# Patient Record
Sex: Female | Born: 1975 | Race: White | Hispanic: No | Smoking: Current every day smoker
Health system: Southern US, Community
[De-identification: ages and names within clinical notes are randomized; demographics above are authoritative.]

---

## 2008-12-22 DIAGNOSIS — O24419 Gestational diabetes mellitus in pregnancy, unspecified control: Secondary | ICD-10-CM | POA: Insufficient documentation

## 2008-12-22 DIAGNOSIS — I10 Essential (primary) hypertension: Secondary | ICD-10-CM | POA: Insufficient documentation

## 2010-09-20 DIAGNOSIS — R0902 Hypoxemia: Secondary | ICD-10-CM | POA: Insufficient documentation

## 2010-09-20 DIAGNOSIS — J45909 Unspecified asthma, uncomplicated: Secondary | ICD-10-CM | POA: Insufficient documentation

## 2011-12-02 ENCOUNTER — Ambulatory Visit: Payer: Self-pay | Admitting: Medical

## 2012-11-23 ENCOUNTER — Emergency Department: Payer: Self-pay | Admitting: Emergency Medicine

## 2012-11-23 LAB — URINALYSIS, COMPLETE
Bacteria: NONE SEEN
Bilirubin,UR: NEGATIVE
Ketone: NEGATIVE
Leukocyte Esterase: NEGATIVE
Ph: 6 (ref 4.5–8.0)
Specific Gravity: 1.004 (ref 1.003–1.030)
WBC UR: 1 /HPF (ref 0–5)

## 2012-11-27 ENCOUNTER — Emergency Department: Payer: Self-pay | Admitting: Internal Medicine

## 2012-11-27 LAB — URINALYSIS, COMPLETE
Bilirubin,UR: NEGATIVE
Nitrite: NEGATIVE
RBC,UR: 130 /HPF (ref 0–5)
Specific Gravity: 1.024 (ref 1.003–1.030)
Squamous Epithelial: 2
WBC UR: 13 /HPF (ref 0–5)

## 2014-12-05 ENCOUNTER — Emergency Department
Admission: EM | Admit: 2014-12-05 | Discharge: 2014-12-05 | Disposition: A | Discharge status: Home / Self Care | Payer: Medicaid Other | Attending: Emergency Medicine | Admitting: Emergency Medicine

## 2014-12-05 ENCOUNTER — Emergency Department: Payer: Medicaid Other

## 2014-12-05 ENCOUNTER — Encounter: Payer: Self-pay | Admitting: *Deleted

## 2014-12-05 DIAGNOSIS — Z720 Tobacco use: Secondary | ICD-10-CM | POA: Insufficient documentation

## 2014-12-05 DIAGNOSIS — I10 Essential (primary) hypertension: Secondary | ICD-10-CM | POA: Diagnosis not present

## 2014-12-05 DIAGNOSIS — R109 Unspecified abdominal pain: Secondary | ICD-10-CM | POA: Diagnosis not present

## 2014-12-05 DIAGNOSIS — R197 Diarrhea, unspecified: Secondary | ICD-10-CM | POA: Diagnosis not present

## 2014-12-05 DIAGNOSIS — R112 Nausea with vomiting, unspecified: Secondary | ICD-10-CM | POA: Diagnosis not present

## 2014-12-05 DIAGNOSIS — J069 Acute upper respiratory infection, unspecified: Secondary | ICD-10-CM | POA: Insufficient documentation

## 2014-12-05 LAB — URINALYSIS COMPLETE WITH MICROSCOPIC (ARMC ONLY)
Bilirubin Urine: NEGATIVE
Glucose, UA: NEGATIVE mg/dL
HGB URINE DIPSTICK: NEGATIVE
Ketones, ur: NEGATIVE mg/dL
Nitrite: NEGATIVE
PH: 6 (ref 5.0–8.0)
Protein, ur: 30 mg/dL — AB
Specific Gravity, Urine: 1.018 (ref 1.005–1.030)

## 2014-12-05 LAB — COMPREHENSIVE METABOLIC PANEL
ALK PHOS: 61 U/L (ref 38–126)
ALT: 15 U/L (ref 14–54)
AST: 14 U/L — ABNORMAL LOW (ref 15–41)
Albumin: 4.1 g/dL (ref 3.5–5.0)
Anion gap: 12 (ref 5–15)
BUN: 14 mg/dL (ref 6–20)
CALCIUM: 8.9 mg/dL (ref 8.9–10.3)
CHLORIDE: 103 mmol/L (ref 101–111)
CO2: 23 mmol/L (ref 22–32)
Creatinine, Ser: 0.46 mg/dL (ref 0.44–1.00)
GFR calc non Af Amer: >60 mL/min (ref >60)
Glucose, Bld: 115 mg/dL — ABNORMAL HIGH (ref 65–99)
Potassium: 2.9 mmol/L — CL (ref 3.5–5.1)
SODIUM: 138 mmol/L (ref 135–145)
Total Bilirubin: 0.7 mg/dL (ref 0.3–1.2)
Total Protein: 7.3 g/dL (ref 6.5–8.1)

## 2014-12-05 LAB — CBC
HEMATOCRIT: 41.7 % (ref 35.0–47.0)
Hemoglobin: 13.7 g/dL (ref 12.0–16.0)
MCH: 24.3 pg — AB (ref 26.0–34.0)
MCHC: 32.8 g/dL (ref 32.0–36.0)
MCV: 74.1 fL — AB (ref 80.0–100.0)
Platelets: 229 10*3/uL (ref 150–440)
RBC: 5.63 MIL/uL — AB (ref 3.80–5.20)
RDW: 16.5 % — AB (ref 11.5–14.5)
WBC: 12.7 10*3/uL — AB (ref 3.6–11.0)

## 2014-12-05 LAB — LIPASE, BLOOD: LIPASE: 28 U/L (ref 22–51)

## 2014-12-05 LAB — TROPONIN I

## 2014-12-05 MED ORDER — SODIUM CHLORIDE 0.9 % IV BOLUS (SEPSIS)
1000.0000 mL | Freq: Once | INTRAVENOUS | Status: AC
  Administered 2014-12-05: 1000 mL via INTRAVENOUS

## 2014-12-05 MED ORDER — POTASSIUM CHLORIDE CRYS ER 20 MEQ PO TBCR: 40.0000 meq | EXTENDED_RELEASE_TABLET | Freq: Once | ORAL | Status: DC

## 2014-12-05 MED ORDER — HYDRALAZINE HCL 20 MG/ML IJ SOLN
5.0000 mg | Freq: Once | INTRAMUSCULAR | Status: AC
  Administered 2014-12-05: 5 mg via INTRAVENOUS
  Filled 2014-12-05: qty 1

## 2014-12-05 MED ORDER — AZITHROMYCIN 250 MG PO TABS: ORAL_TABLET | ORAL | Status: AC

## 2014-12-05 MED ORDER — POTASSIUM CHLORIDE CRYS ER 20 MEQ PO TBCR
40.0000 meq | EXTENDED_RELEASE_TABLET | Freq: Once | ORAL | Status: AC
  Administered 2014-12-05: 40 meq via ORAL

## 2014-12-05 MED ORDER — POTASSIUM CHLORIDE CRYS ER 20 MEQ PO TBCR
EXTENDED_RELEASE_TABLET | ORAL | Status: AC
  Administered 2014-12-05: 40 meq via ORAL
  Filled 2014-12-05: qty 2

## 2014-12-05 MED ORDER — PREDNISONE 20 MG PO TABS: 60.0000 mg | ORAL_TABLET | Freq: Every day | ORAL | Status: AC

## 2014-12-05 NOTE — Discharge Instructions (Signed)
Please make an appointment with the primary care physician or with the [facility] clinic if you do not have a primary care physician. Please have them reevaluate you for your cough and cold symptoms, and also recheck your blood pressure because it was high in the emergency department today.  Please return to the emergency department you develop severe shortness of breath, chest pain, fainting, or any other symptoms concerning to you.

## 2014-12-05 NOTE — ED Provider Notes (Signed)
[facility] Emergency Department Provider Note  ____________________________________________  Time seen: Approximately 3:25 PM  I have reviewed the triage vital signs and the nursing notes.   HISTORY  Chief Complaint Nausea; Emesis; Headache; and Chest Pain    HPI Tami Wallace is a 39 y.o. female , otherwise healthy, presenting with general malaise, cough and cold symptoms, nausea vomiting and diarrhea. Patient reports that 5 days ago she had 24 hours of nonbloody vomiting. Then she developed 24 hours of loose stool that was also nonbloody. Over the past 3 days she has had decreased by mouth intake due to decreased appetite, and has developed congestion and a nonproductive cough. She notes that she has occasional chest tightness with exertion that resolves with rest. She also has mild shortness of breath with exertion. She also reports generalized malaise and myalgias. She is an ongoing tobacco user.No known sick contacts or recent travel outside the [place].   History reviewed. No pertinent past medical history.  No chronic medical illnesses  There are no active problems to display for this patient.   History reviewed. No pertinent past surgical history.  Current Outpatient Rx  Name  Route  Sig  Dispense  Refill  . azithromycin (ZITHROMAX Z-PAK) 250 MG tablet      Take 2 tablets (500 mg) on  Day 1,  followed by 1 tablet (250 mg) once daily on Days 2 through 5.   6 each   0   . predniSONE (DELTASONE) 20 MG tablet   Oral   Take 3 tablets (60 mg total) by mouth daily.   15 tablet   0     Allergies Review of patient's allergies indicates no known allergies.  History reviewed. No pertinent family history.  Social History Social History  Substance Use Topics  . Smoking status: Current Every Day Smoker  . Smokeless tobacco: None  . Alcohol Use: None    Review of Systems Constitutional: No fever/chills.  No syncope Eyes: No visual  changes. ENT: No sore throat. Positive for congestion. Cardiovascular: Positive for chest tightness with exertion. No palpitations.  Respiratory: Mild shortness of breath with exertion only.  Nonproductive cough. Gastrointestinal: No abdominal pain.  Nausea, vomiting, diarrhea which have all resolved.  No constipation. Genitourinary: Negative for dysuria. Musculoskeletal: Negative for back pain. Skin: Negative for rash. Neurological: Negative for headaches, focal weakness or numbness.  10-point ROS otherwise negative.  ____________________________________________   PHYSICAL EXAM:  VITAL SIGNS: ED Triage Vitals  Enc Vitals Group     BP 12/05/14 1159 173/79 mmHg     Pulse Rate 12/05/14 1159 66     Resp 12/05/14 1159 20     Temp 12/05/14 1159 98.7 F (37.1 C)     Temp Source 12/05/14 1159 Oral     SpO2 12/05/14 1159 99 %     Weight 12/05/14 1159 200 lb (90.719 kg)     Height 12/05/14 1159  (1.6 m)     Head Cir --      Peak Flow --      Pain Score 12/05/14 1200 7     Pain Loc --      Pain Edu? --      Excl. in GC? --     Constitutional: Patient is alert and oriented and resting in the stretcher comfortably. She is speaking in full sentences and answering questions properly. Eyes: Conjunctivae are normal.  EOMI. no scleral icterus. Head: Atraumatic. Notable for significant facial hair. Nose:  Positive for congestion without rhinorrhea. Mouth/Throat: Mucous membranes are dry. Neck: No stridor.  Supple. JVD. Cardiovascular: Normal rate, regular rhythm. No murmurs, rubs or gallops.  Respiratory: Normal respiratory effort.  No retractions or accessory muscle use.. Lungs CTAB.  Mild rales in the right lung base, minimal expiratory wheezing.. Gastrointestinal: Obese. Soft and nondistended. Mild tenderness to palpation diffusely in the right side without a Murphy's sign.. No peritoneal signs. Musculoskeletal: No LE edema.  Neurologic:  Normal speech and language. No gross focal  neurologic deficits are appreciated.  Skin:  Skin is warm, dry and intact. No rash noted. Psychiatric: Mood and affect are normal. Speech and behavior are normal.  Normal judgement.  ____________________________________________   LABS (all labs ordered are listed, but only abnormal results are displayed)  Labs Reviewed  COMPREHENSIVE METABOLIC PANEL - Abnormal; Notable for the following:    Potassium 2.9 (*)    Glucose, Bld 115 (*)    AST 14 (*)    All other components within normal limits  CBC - Abnormal; Notable for the following:    WBC 12.7 (*)    RBC 5.63 (*)    MCV 74.1 (*)    MCH 24.3 (*)    RDW 16.5 (*)    All other components within normal limits  URINALYSIS COMPLETEWITH MICROSCOPIC ([facility] ONLY) - Abnormal; Notable for the following:    Color, Urine YELLOW (*)    APPearance HAZY (*)    Protein, ur 30 (*)    Leukocytes, UA TRACE (*)    Bacteria, UA RARE (*)    Squamous Epithelial / LPF 0-5 (*)    All other components within normal limits  LIPASE, BLOOD  TROPONIN I  POC URINE PREG, ED   ____________________________________________  EKG  ED ECG REPORT I, Rockne Menghini, the attending physician, personally viewed and interpreted this ECG.   Date: 12/05/2014  EKG Time: 1203  Rate: 65  Rhythm: normal sinus rhythm  Axis: Leftward  Intervals:none  ST&T Change: No ST elevation. No STEMI. No STEMI changes.  ____________________________________________  RADIOLOGY  Dg Chest 2 View  12/05/2014   CLINICAL DATA:  Nonproductive cough.  Fever.  EXAM: CHEST  2 VIEW  COMPARISON:  Chest radiograph 12/02/2011  FINDINGS: Stable cardiac and mediastinal contours. No consolidative pulmonary opacities. No pleural effusion or pneumothorax. Regional skeleton is unremarkable  IMPRESSION: No acute cardiopulmonary process.   Electronically Signed   By: Annia Belt M.D.   On: 12/05/2014 15:58    ____________________________________________   PROCEDURES  Procedure(s)  performed: None  Critical Care performed: No ____________________________________________   INITIAL IMPRESSION / ASSESSMENT AND PLAN / ED COURSE  Pertinent labs & imaging results that were available during my care of the patient were reviewed by me and considered in my medical decision making (see chart for details).  39 y.o. female, tobacco user but otherwise healthy, presenting with resolved nausea vomiting and diarrhea, now with cough and cold symptoms. Her lung exam does demonstrate some rales in the right lung base. Consider flulike illness, pneumonia area did her abdomen is soft with nonfocal findings. Acute intra-abdominal pathology is much less likely including appendicitis, obstruction, or colitis. Plan to get a chest x-ray, treat her hypokalemia, rehydrate her with IV fluids, and reassess her for final disposition.  ----------------------------------------- 4:35 PM on 12/05/2014 -----------------------------------------  At this time, the patient continues to be stable with the exception of an elevated blood pressure. She has never been diagnosed with hypertension in the past, but this does run  in her family. I have encouraged her to follow up with her primary care physician for this. She has received IV fluids and is tolerating by mouth. It is unlikely that she has an acute intra-abdominal pathology. Her chest x-ray does not show a pneumonia, so it is likely that she has an upper respiratory illness that is exacerbated by her smoking. I will discharge her with steroids given her long tobacco history, and give her prescription for Z-Pak which she can initiate if she develops fever, worsening shortness of breath, or her symptoms are not resolving. She will follow up with her PMD. She understands return precautions and follow-up instructions.  ____________________________________________  FINAL CLINICAL IMPRESSION(S) / ED DIAGNOSES  Final diagnoses:  Nausea vomiting and diarrhea  URI  (upper respiratory infection)  Essential hypertension      NEW MEDICATIONS STARTED DURING THIS VISIT:  New Prescriptions   AZITHROMYCIN (ZITHROMAX Z-PAK) 250 MG TABLET    Take 2 tablets (500 mg) on  Day 1,  followed by 1 tablet (250 mg) once daily on Days 2 through 5.   PREDNISONE (DELTASONE) 20 MG TABLET    Take 3 tablets (60 mg total) by mouth daily.     Rockne Menghini, MD 12/05/14 [phone]

## 2014-12-05 NOTE — ED Notes (Signed)
Pt states vomiting on Monday and since then chest and head tightness with sharp pain, states she has been able to eat and drink

## 2014-12-05 NOTE — ED Notes (Signed)
MD made aware of patients current BP, per MD, OK to discharge patient.

## 2019-04-15 ENCOUNTER — Other Ambulatory Visit: Payer: Self-pay

## 2019-04-15 ENCOUNTER — Emergency Department
Admission: EM | Admit: 2019-04-15 | Discharge: 2019-04-15 | Disposition: A | Discharge status: Home / Self Care | Payer: Self-pay | Attending: Emergency Medicine | Admitting: Emergency Medicine

## 2019-04-15 ENCOUNTER — Emergency Department: Payer: Self-pay

## 2019-04-15 DIAGNOSIS — R030 Elevated blood-pressure reading, without diagnosis of hypertension: Secondary | ICD-10-CM | POA: Insufficient documentation

## 2019-04-15 DIAGNOSIS — M25461 Effusion, right knee: Secondary | ICD-10-CM | POA: Insufficient documentation

## 2019-04-15 DIAGNOSIS — F17210 Nicotine dependence, cigarettes, uncomplicated: Secondary | ICD-10-CM | POA: Insufficient documentation

## 2019-04-15 MED ORDER — NAPROXEN 500 MG PO TABS: 500.0000 mg | ORAL_TABLET | Freq: Two times a day (BID) | ORAL | Status: AC

## 2019-04-15 MED ORDER — TRAMADOL HCL 50 MG PO TABS: 50.0000 mg | ORAL_TABLET | Freq: Four times a day (QID) | ORAL | 0 refills | Status: AC | PRN

## 2019-04-15 NOTE — ED Triage Notes (Signed)
Outer right knee pain and swelling with no injury X 2 weeks.

## 2019-04-15 NOTE — ED Notes (Signed)
See triage note  Presents with right knee pain   States pain started about 2 weeks ago  No injury  Ambulates with slight limp

## 2019-04-15 NOTE — ED Provider Notes (Signed)
[facility] Emergency Department Provider Note   ____________________________________________   First MD Initiated Contact with Patient 04/15/19 1250     (approximate)  I have reviewed the triage vital signs and the nursing notes.   HISTORY  Chief Complaint Knee Pain    HPI Tami Wallace is a 44 y.o. female patient complains of 2 weeks of increasing right knee pain.  Patient denies provocative incident for complaint.  Patient rates the pain as a 6/10.  Patient described pain as "achy".  No palliative measures for complaint it was noted patient blood pressure elevated 215/94.  Patient states she surprised that she has no history of elevated blood pressures.  Patient has headache, vision disturbance, vertigo.  We will retake blood pressure.      History reviewed. No pertinent past medical history.  There are no problems to display for this patient.   History reviewed. No pertinent surgical history.  Prior to Admission medications   Medication Sig Start Date End Date Taking? Authorizing Provider  naproxen (NAPROSYN) 500 MG tablet Take 1 tablet (500 mg total) by mouth 2 (two) times daily with a meal. 04/15/19   Joni Reining, PA-C  traMADol (ULTRAM) 50 MG tablet Take 1 tablet (50 mg total) by mouth every 6 (six) hours as needed for up to 3 days. 04/15/19 04/18/19  Joni Reining, PA-C    Allergies Patient has no known allergies.  No family history on file.  Social History Social History   Tobacco Use  . Smoking status: Current Every Day Smoker  Substance Use Topics  . Alcohol use: Not on file  . Drug use: Not on file    Review of Systems Constitutional: No fever/chills Eyes: No visual changes. ENT: No sore throat. Cardiovascular: Denies chest pain. Respiratory: Denies shortness of breath. Gastrointestinal: No abdominal pain.  No nausea, no vomiting.  No diarrhea.  No constipation. Genitourinary: Negative for  dysuria. Musculoskeletal: Right knee pain. Skin: Negative for rash. Neurological: Negative for headaches, focal weakness or numbness.   ____________________________________________   PHYSICAL EXAM:  VITAL SIGNS: ED Triage Vitals [04/15/19 1222]  Enc Vitals Group     BP (!) 215/94     Pulse Rate (!) 102     Resp 18     Temp 98.7 F (37.1 C)     Temp Source Oral     SpO2 97 %     Weight 195 lb (88.5 kg)     Height 5\' 2"  (1.575 m)     Head Circumference      Peak Flow      Pain Score 6     Pain Loc      Pain Edu?      Excl. in GC?    Constitutional: Alert and oriented. Well appearing and in no acute distress. Cardiovascular: Normal rate, regular rhythm. Grossly normal heart sounds.  Good peripheral circulation. Respiratory: Normal respiratory effort.  No retractions. Lungs CTAB. Musculoskeletal: No obvious deformity to the right knee.  Moderate guarding palpation at insertion point of the MCL.   Neurologic:  Normal speech and language. No gross focal neurologic deficits are appreciated. No gait instability. Skin:  Skin is warm, dry and intact. No rash noted. Psychiatric: Mood and affect are normal. Speech and behavior are normal.  ____________________________________________   LABS (all labs ordered are listed, but only abnormal results are displayed)  Labs Reviewed - No data to display ____________________________________________  EKG   ____________________________________________  RADIOLOGY  ED  MD interpretation:    Official radiology report(s): DG Knee Complete 4 Views Right  Result Date: 04/15/2019 CLINICAL DATA:  Knee pain EXAM: RIGHT KNEE - COMPLETE 4+ VIEW COMPARISON:  None. FINDINGS: There is no acute fracture or dislocation. Minimal marginal spurring. There is no joint space narrowing. Possible joint effusion on suboptimal lateral view. IMPRESSION: No significant osseous abnormality.  Possible joint effusion. Electronically Signed   By: Macy Mis  M.D.   On: 04/15/2019 13:26    ____________________________________________   PROCEDURES  Procedure(s) performed (including Critical Care):  Procedures   ____________________________________________   INITIAL IMPRESSION / ASSESSMENT AND PLAN / ED COURSE  As part of my medical decision making, I reviewed the following data within the [place]     Patient presents with 2 weeks of nontraumatic knee pain and edema.  Discussed x-ray findings with patient.  Physical exam consistent with knee effusion.  Patient knee was Ace wrapped and she is given discharge care instruction.  Advised take medication as directed.  Advised follow orthopedic if no improvement in 7 to 10 days.   Tami Wallace was evaluated in Emergency Department on 04/15/2019 for the symptoms described in the history of present illness. She was evaluated in the context of the global COVID-19 pandemic, which necessitated consideration that the patient might be at risk for infection with the SARS-CoV-2 virus that causes COVID-19. Institutional protocols and algorithms that pertain to the evaluation of patients at risk for COVID-19 are in a state of rapid change based on information released by regulatory bodies including the CDC and federal and state organizations. These policies and algorithms were followed during the patient's care in the ED.       ____________________________________________   FINAL CLINICAL IMPRESSION(S) / ED DIAGNOSES  Final diagnoses:  Effusion of right knee  Elevated blood-pressure reading without diagnosis of hypertension     ED Discharge Orders         Ordered    naproxen (NAPROSYN) 500 MG tablet  2 times daily with meals     04/15/19 1341    traMADol (ULTRAM) 50 MG tablet  Every 6 hours PRN     04/15/19 1341           Note:  This document was prepared using Dragon voice recognition software and may include unintentional dictation errors.    Sable Feil,  PA-C 04/15/19 1406    Carrie Mew, MD 04/15/19 [phone]

## 2019-04-15 NOTE — Discharge Instructions (Addendum)
Follow discharge care instruction take medication as directed.  If no improvement in 1 week call orthopedic doctor listed on your discharge care instructions.  Advised 3-day blood pressure check [facility].

## 2019-08-25 ENCOUNTER — Emergency Department
Admission: EM | Admit: 2019-08-25 | Discharge: 2019-08-25 | Disposition: A | Discharge status: Home / Self Care | Payer: Medicaid Other | Attending: Emergency Medicine | Admitting: Emergency Medicine

## 2019-08-25 ENCOUNTER — Encounter: Payer: Self-pay | Admitting: Emergency Medicine

## 2019-08-25 ENCOUNTER — Other Ambulatory Visit: Payer: Self-pay

## 2019-08-25 ENCOUNTER — Emergency Department: Payer: Medicaid Other

## 2019-08-25 DIAGNOSIS — M25561 Pain in right knee: Secondary | ICD-10-CM | POA: Insufficient documentation

## 2019-08-25 DIAGNOSIS — I10 Essential (primary) hypertension: Secondary | ICD-10-CM | POA: Insufficient documentation

## 2019-08-25 DIAGNOSIS — Z79899 Other long term (current) drug therapy: Secondary | ICD-10-CM | POA: Insufficient documentation

## 2019-08-25 DIAGNOSIS — F17200 Nicotine dependence, unspecified, uncomplicated: Secondary | ICD-10-CM | POA: Insufficient documentation

## 2019-08-25 LAB — BASIC METABOLIC PANEL
Anion gap: 12 (ref 5–15)
BUN: 13 mg/dL (ref 6–20)
CO2: 22 mmol/L (ref 22–32)
Calcium: 8.9 mg/dL (ref 8.9–10.3)
Chloride: 103 mmol/L (ref 98–111)
Creatinine, Ser: 0.59 mg/dL (ref 0.44–1.00)
GFR calc Af Amer: >60 mL/min (ref >60)
GFR calc non Af Amer: >60 mL/min (ref >60)
Glucose, Bld: 114 mg/dL — ABNORMAL HIGH (ref 70–99)
Potassium: 3.3 mmol/L — ABNORMAL LOW (ref 3.5–5.1)
Sodium: 137 mmol/L (ref 135–145)

## 2019-08-25 LAB — CBC
HCT: 50.1 % — ABNORMAL HIGH (ref 36.0–46.0)
Hemoglobin: 17.1 g/dL — ABNORMAL HIGH (ref 12.0–15.0)
MCH: 29 pg (ref 26.0–34.0)
MCHC: 34.1 g/dL (ref 30.0–36.0)
MCV: 85.1 fL (ref 80.0–100.0)
Platelets: 221 10*3/uL (ref 150–400)
RBC: 5.89 MIL/uL — ABNORMAL HIGH (ref 3.87–5.11)
RDW: 13.9 % (ref 11.5–15.5)
WBC: 13.1 10*3/uL — ABNORMAL HIGH (ref 4.0–10.5)
nRBC: 0 % (ref 0.0–0.2)

## 2019-08-25 MED ORDER — IBUPROFEN 400 MG PO TABS
600.0000 mg | ORAL_TABLET | Freq: Once | ORAL | Status: AC
  Administered 2019-08-25: 600 mg via ORAL
  Filled 2019-08-25: qty 2

## 2019-08-25 MED ORDER — LISINOPRIL 10 MG PO TABS
10.0000 mg | ORAL_TABLET | Freq: Once | ORAL | Status: AC
  Administered 2019-08-25: 10 mg via ORAL
  Filled 2019-08-25: qty 1

## 2019-08-25 MED ORDER — CLONIDINE HCL 0.1 MG PO TABS
0.1000 mg | ORAL_TABLET | Freq: Once | ORAL | Status: AC
  Administered 2019-08-25: 0.1 mg via ORAL
  Filled 2019-08-25: qty 1

## 2019-08-25 MED ORDER — BLOOD PRESSURE KIT: 1.0000 | PACK | Freq: Once | 0 refills | Status: AC

## 2019-08-25 MED ORDER — LISINOPRIL 10 MG PO TABS
10.0000 mg | ORAL_TABLET | Freq: Every day | ORAL | 11 refills | Status: AC
Start: 2019-08-25 — End: 2020-08-24

## 2019-08-25 NOTE — ED Triage Notes (Signed)
Patient presents to the ED with pain and swelling to right knee for several months and headache x 2 days intermittently.  Patient denies dizziness and blurry vision.  Patient denies history of hypertension but reports family history of hypertension.

## 2019-08-25 NOTE — ED Provider Notes (Signed)
[facility] Emergency Department Provider Note  ____________________________________________   First MD Initiated Contact with Patient 08/25/19 1200     (approximate)  I have reviewed the triage vital signs and the nursing notes.   HISTORY  Chief Complaint Headache and Leg Swelling    HPI Tami Wallace is a 44 y.o. female  With no significant PMHx here with headache. Pt reports 3-4 days of gradual onset, aching, generalized headache. HA began gradually and ahs been off and on. It improves with tylenol then returns. She denies any associated vision changes, numbness, weakness, or other symptoms. No nausea, vomiting. No neck pain, neck stiffness, or fever. She has a strong family h/o HTN and admits that she has not been seen recently by an MD. No recent stressors. She does also c/o R knee pain but this has been a chronic, recurrent issue for which she was just seen on 6/19. No new redness, welling, warmth, or injury.       History reviewed. No pertinent past medical history.  There are no problems to display for this patient.   History reviewed. No pertinent surgical history.  Prior to Admission medications   Medication Sig Start Date End Date Taking? Authorizing Provider  Blood Pressure KIT 1 kit by Does not apply route once for 1 dose. 08/25/19 08/25/19  Duffy Bruce, MD  lisinopril (ZESTRIL) 10 MG tablet Take 1 tablet (10 mg total) by mouth daily. 08/25/19 08/24/20  Duffy Bruce, MD  naproxen (NAPROSYN) 500 MG tablet Take 1 tablet (500 mg total) by mouth 2 (two) times daily with a meal. 04/15/19   Sable Feil, PA-C    Allergies Patient has no known allergies.  No family history on file.  Social History Social History   Tobacco Use  . Smoking status: Current Every Day Smoker  . Smokeless tobacco: Never Used  Substance Use Topics  . Alcohol use: Not on file  . Drug use: Not on file    Review of Systems  Review of Systems    Constitutional: Positive for fatigue. Negative for fever.  HENT: Negative for congestion and sore throat.   Eyes: Negative for visual disturbance.  Respiratory: Negative for cough and shortness of breath.   Cardiovascular: Negative for chest pain.  Gastrointestinal: Negative for abdominal pain, diarrhea, nausea and vomiting.  Genitourinary: Negative for flank pain.  Musculoskeletal: Positive for arthralgias. Negative for back pain and neck pain.  Skin: Negative for rash and wound.  Neurological: Positive for headaches. Negative for weakness.  All other systems reviewed and are negative.    ____________________________________________  PHYSICAL EXAM:      VITAL SIGNS: ED Triage Vitals  Enc Vitals Group     BP 08/25/19 0905 (!) 209/100     Pulse Rate 08/25/19 0905 85     Resp 08/25/19 0905 18     Temp 08/25/19 0905 98.2 F (36.8 C)     Temp Source 08/25/19 0905 Oral     SpO2 08/25/19 0905 97 %     Weight 08/25/19 0905 200 lb (90.7 kg)     Height 08/25/19 0905 5' 3"  (1.6 m)     Head Circumference --      Peak Flow --      Pain Score 08/25/19 0910 2     Pain Loc --      Pain Edu? --      Excl. in [place]? --      Physical Exam Vitals and nursing note reviewed.  Constitutional:      General: She is not in acute distress.    Appearance: She is well-developed.  HENT:     Head: Normocephalic and atraumatic.  Eyes:     Conjunctiva/sclera: Conjunctivae normal.  Cardiovascular:     Rate and Rhythm: Normal rate and regular rhythm.     Heart sounds: Normal heart sounds.  Pulmonary:     Effort: Pulmonary effort is normal. No respiratory distress.     Breath sounds: No wheezing.  Abdominal:     General: There is no distension.  Musculoskeletal:     Cervical back: Neck supple.     Comments: Mild bony TTP over R anterior knee, no bruising or deformity,. No joint effusion, warmth, or pain with pROM.  Skin:    General: Skin is warm.     Capillary Refill: Capillary refill takes  less than 2 seconds.     Findings: No rash.  Neurological:     Mental Status: She is alert and oriented to person, place, and time.     Motor: No abnormal muscle tone.     Comments: Neurological Exam:  Mental Status: Alert and oriented to person, place, and time. Attention and concentration normal. Speech clear. Recent memory is intact. Cranial Nerves: Visual fields grossly intact. EOMI and PERRLA. No nystagmus noted. Facial sensation intact at forehead, maxillary cheek, and chin/mandible bilaterally. No facial asymmetry or weakness. Hearing grossly normal. Uvula is midline, and palate elevates symmetrically. Normal SCM and trapezius strength. Tongue midline without fasciculations. Motor: Muscle strength 5/5 in proximal and distal UE and LE bilaterally. No pronator drift. Muscle tone normal. Reflexes: Symmetric  Sensation: Intact to light touch in upper and lower extremities distally bilaterally.  Gait: Normal without ataxia. Coordination: Normal FTN bilaterally.          ____________________________________________   LABS (all labs ordered are listed, but only abnormal results are displayed)  Labs Reviewed  CBC - Abnormal; Notable for the following components:      Result Value   WBC 13.1 (*)    RBC 5.89 (*)    Hemoglobin 17.1 (*)    HCT 50.1 (*)    All other components within normal limits  BASIC METABOLIC PANEL - Abnormal; Notable for the following components:   Potassium 3.3 (*)    Glucose, Bld 114 (*)    All other components within normal limits    ____________________________________________  EKG: Normal sinus rhythm, VR 82. PR 148, QRS 98, QTc 502. No acute ST elevations. Minimal LVH, prolonged QT, o/w no acute changes. ________________________________________  RADIOLOGY All imaging, including plain films, CT scans, and ultrasounds, independently reviewed by me, and interpretations confirmed via formal radiology reads.  ED MD interpretation:   CT Head:  [place]  Official radiology report(s): CT Head Wo Contrast  Result Date: 08/25/2019 CLINICAL DATA:  Headache for 2 days. EXAM: CT HEAD WITHOUT CONTRAST TECHNIQUE: Contiguous axial images were obtained from the base of the skull through the vertex without intravenous contrast. COMPARISON:  None. FINDINGS: Brain: No evidence of acute infarction, hemorrhage, hydrocephalus, extra-axial collection or mass lesion/mass effect. Vascular: No hyperdense vessel or unexpected calcification. Skull: Normal. Negative for fracture or focal lesion. Sinuses/Orbits: Normal Other: None IMPRESSION: Normal exam. Electronically Signed   By: Lorriane Shire M.D.   On: 08/25/2019 09:43    ____________________________________________  PROCEDURES   Procedure(s) performed (including Critical Care):  Procedures  ____________________________________________  INITIAL IMPRESSION / MDM / [place] / ED COURSE  As part of  my medical decision making, I reviewed the following data within the [place] notes reviewed and incorporated, Old chart reviewed, Notes from prior ED visits, and [place] Controlled Substance Database       *Tami Wallace was evaluated in Emergency Department on 08/25/2019 for the symptoms described in the history of present illness. She was evaluated in the context of the global COVID-19 pandemic, which necessitated consideration that the patient might be at risk for infection with the SARS-CoV-2 virus that causes COVID-19. Institutional protocols and algorithms that pertain to the evaluation of patients at risk for COVID-19 are in a state of rapid change based on information released by regulatory bodies including the CDC and federal and state organizations. These policies and algorithms were followed during the patient's care in the ED.  Some ED evaluations and interventions may be delayed as a result of limited staffing during the pandemic.*     Medical Decision  Making:  44 yo F here with mild symptomatic hypertension. CT head neg. Neuro exam nonfocal. HA began gradually and is not concerning for [facility]. No fever, chills or signs of meningitis or encephalitis. EKG is non ischemic, no CP or signs of ACS or HTN emergency. Will treat with lisinopril. BP improving in ED and feels much better. Will have her call and f/u with PCP in 1-2 weeks. Return precautions given.  Re: knee pain - this is chronic, not acutely changed. Exam is not concerning for infectious or inflammatory arthritis. Distal NVI.  ____________________________________________  FINAL CLINICAL IMPRESSION(S) / ED DIAGNOSES  Final diagnoses:  Essential hypertension     MEDICATIONS GIVEN DURING THIS VISIT:  Medications  cloNIDine (CATAPRES) tablet 0.1 mg (0.1 mg Oral Given 08/25/19 1250)  lisinopril (ZESTRIL) tablet 10 mg (10 mg Oral Given 08/25/19 1250)  ibuprofen (ADVIL) tablet 600 mg (600 mg Oral Given 08/25/19 1250)     ED Discharge Orders         Ordered    lisinopril (ZESTRIL) 10 MG tablet  Daily     Discontinue  Reprint     08/25/19 1413    Blood Pressure KIT   Once     Discontinue  Reprint     08/25/19 1413           Note:  This document was prepared using Dragon voice recognition software and may include unintentional dictation errors.   Duffy Bruce, MD 08/25/19 1459

## 2019-08-25 NOTE — Discharge Instructions (Addendum)
Call the number on your Medicaid card, or one of the provided clinics, for follow-up int he next 1-2 weeks  Check your BP no more than twice a day, and keep this recorded in a book or phone for follow-up

## 2019-08-25 NOTE — ED Notes (Signed)
NAD noted at time of D/C. Pt denies questions or concerns. Pt ambulatory to the lobby at this time.  

## 2019-08-25 NOTE — ED Notes (Signed)
Pt presents to ED via POV with c/o intermittent HA x several weeks, pt also c/o HTN, states no hx of HTN but significant family hx of HTN at this time. Pt ambulatory from outside when called for room, states "I just smoked a cigarette so it's probably going to be high".

## 2019-09-16 ENCOUNTER — Telehealth: Payer: Self-pay | Admitting: General Practice

## 2019-09-16 NOTE — Telephone Encounter (Signed)
Individual has been contacted 3+ times regarding ED referral. No further attempts to contact individual will be made. 

## 2020-02-17 DIAGNOSIS — I517 Cardiomegaly: Secondary | ICD-10-CM | POA: Insufficient documentation

## 2020-02-17 DIAGNOSIS — F17200 Nicotine dependence, unspecified, uncomplicated: Secondary | ICD-10-CM | POA: Insufficient documentation

## 2020-02-17 DIAGNOSIS — I252 Old myocardial infarction: Secondary | ICD-10-CM | POA: Insufficient documentation

## 2020-02-17 DIAGNOSIS — E785 Hyperlipidemia, unspecified: Secondary | ICD-10-CM | POA: Insufficient documentation

## 2020-02-17 DIAGNOSIS — R7303 Prediabetes: Secondary | ICD-10-CM | POA: Insufficient documentation

## 2020-02-17 DIAGNOSIS — E669 Obesity, unspecified: Secondary | ICD-10-CM | POA: Insufficient documentation

## 2020-02-17 DIAGNOSIS — I2510 Atherosclerotic heart disease of native coronary artery without angina pectoris: Secondary | ICD-10-CM | POA: Insufficient documentation

## 2020-02-17 DIAGNOSIS — M67912 Unspecified disorder of synovium and tendon, left shoulder: Secondary | ICD-10-CM | POA: Insufficient documentation

## 2020-02-17 DIAGNOSIS — L680 Hirsutism: Secondary | ICD-10-CM | POA: Insufficient documentation

## 2020-02-17 DIAGNOSIS — K8020 Calculus of gallbladder without cholecystitis without obstruction: Secondary | ICD-10-CM | POA: Insufficient documentation

## 2020-02-17 DIAGNOSIS — E66811 Obesity, class 1: Secondary | ICD-10-CM | POA: Insufficient documentation

## 2020-08-17 DIAGNOSIS — R1011 Right upper quadrant pain: Secondary | ICD-10-CM | POA: Insufficient documentation

## 2020-08-17 DIAGNOSIS — I81 Portal vein thrombosis: Secondary | ICD-10-CM | POA: Insufficient documentation

## 2020-08-17 DIAGNOSIS — K769 Liver disease, unspecified: Secondary | ICD-10-CM | POA: Insufficient documentation

## 2020-08-17 DIAGNOSIS — Z8719 Personal history of other diseases of the digestive system: Secondary | ICD-10-CM | POA: Insufficient documentation

## 2020-08-23 DIAGNOSIS — E876 Hypokalemia: Secondary | ICD-10-CM | POA: Insufficient documentation

## 2020-08-23 DIAGNOSIS — E871 Hypo-osmolality and hyponatremia: Secondary | ICD-10-CM | POA: Insufficient documentation

## 2020-08-25 DIAGNOSIS — K750 Abscess of liver: Secondary | ICD-10-CM | POA: Insufficient documentation

## 2021-09-11 IMAGING — CT CT HEAD W/O CM
3 series · 16 of 47 positions shown, 19 images · non-contrast
Comparison: None.

CLINICAL DATA: Headache for 2 days.

EXAM:
CT HEAD WITHOUT CONTRAST
TECHNIQUE: Contiguous axial images were obtained from the base of the skull
through the vertex without intravenous contrast.

[Series 2: head wo · axial · 0.39mm/px · z∈[-182,-57]mm · 10 of 30 slices shown, 13 images]
[im 3/30  brain]
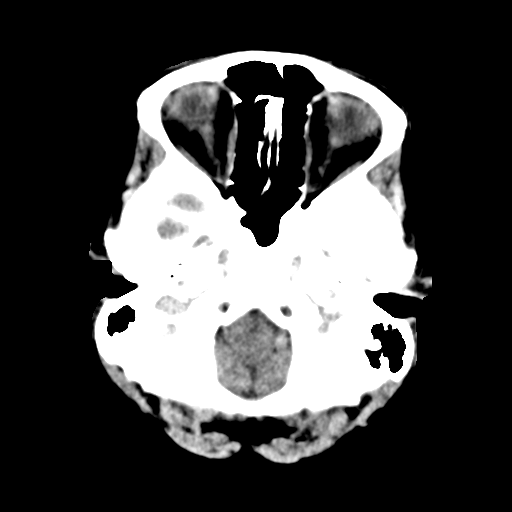
[im 3/30  bone]
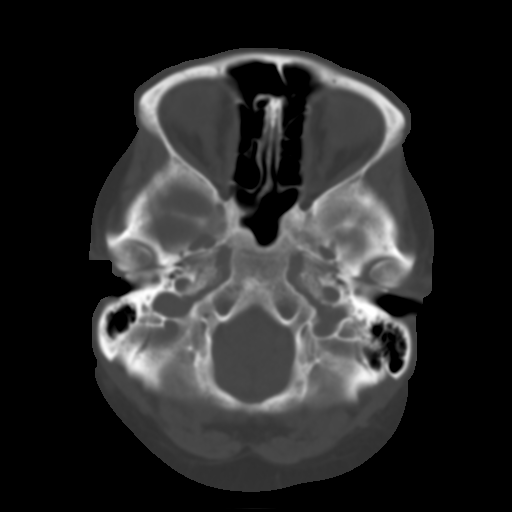
[im 6/30  brain]
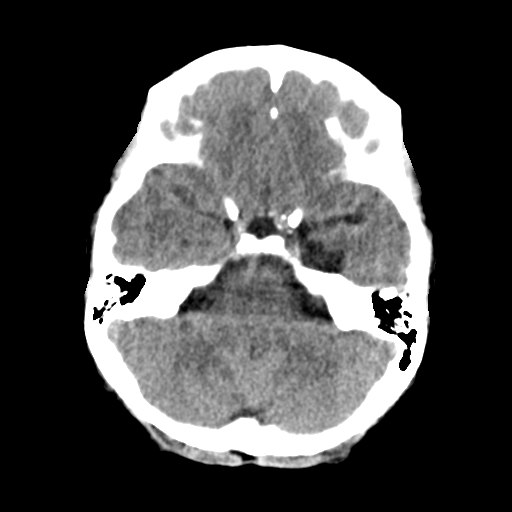
[im 9/30  brain]
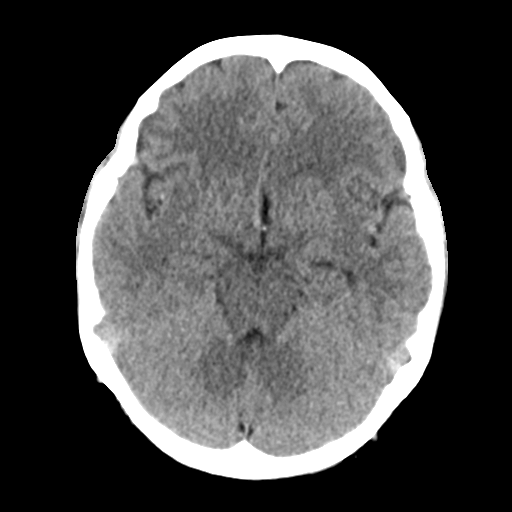
[im 11/30  brain]
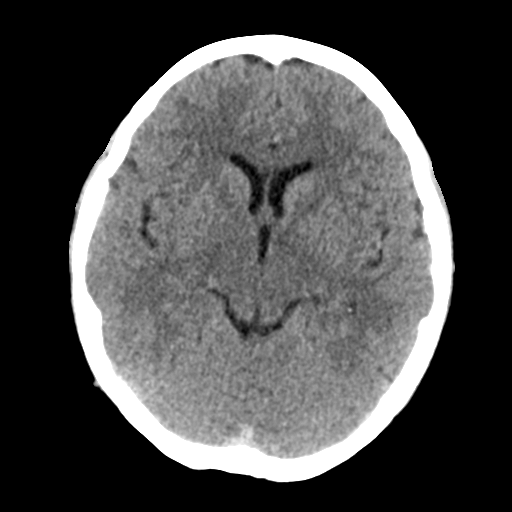
[im 14/30  brain]
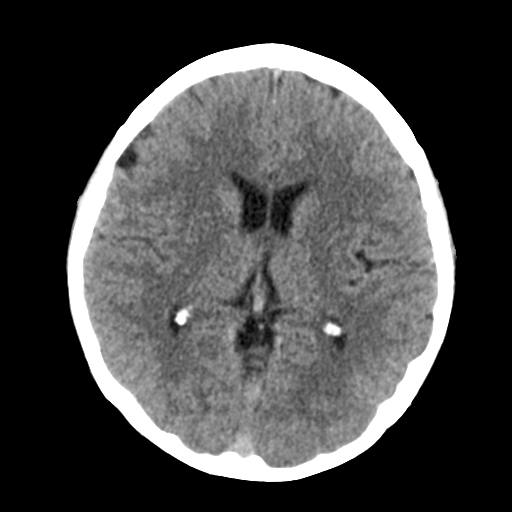
[im 14/30  bone]
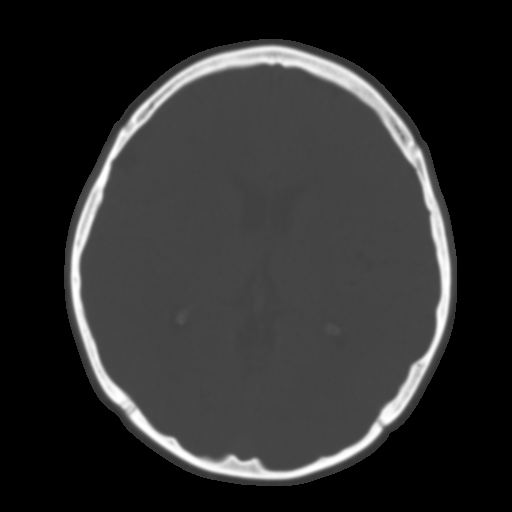
[im 17/30  brain]
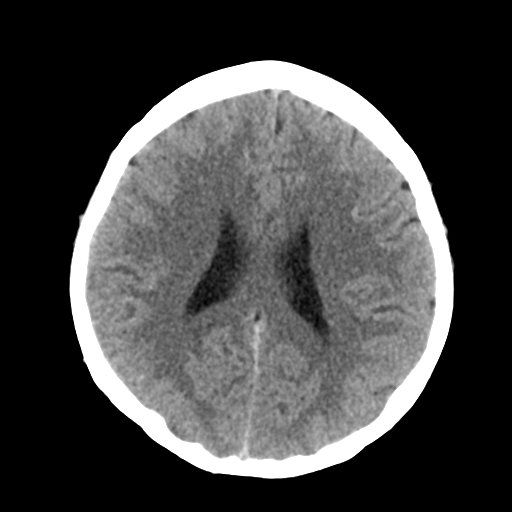
[im 20/30  brain]
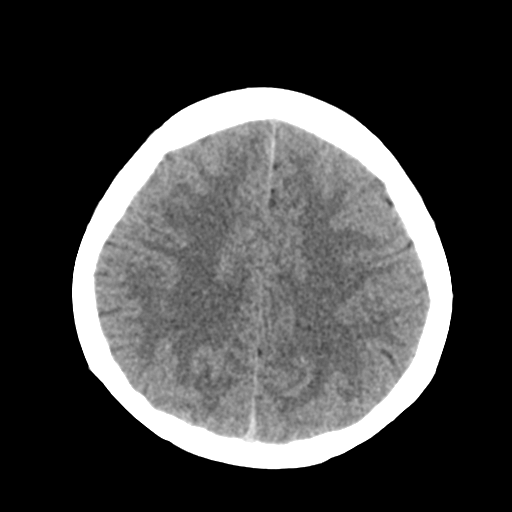
[im 23/30  brain]
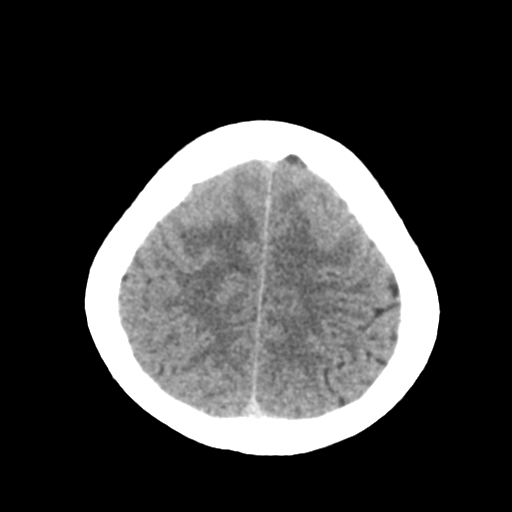
[im 25/30  brain]
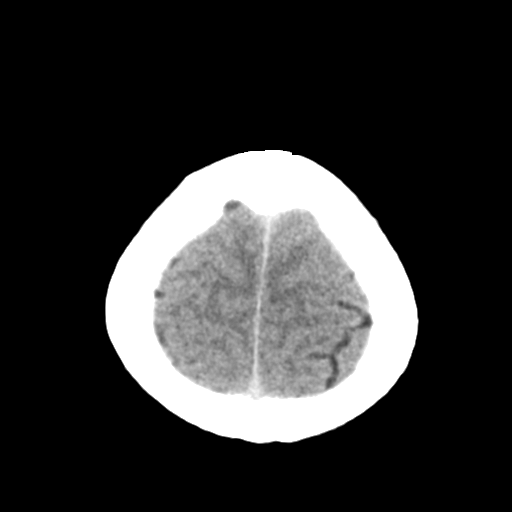
[im 25/30  bone]
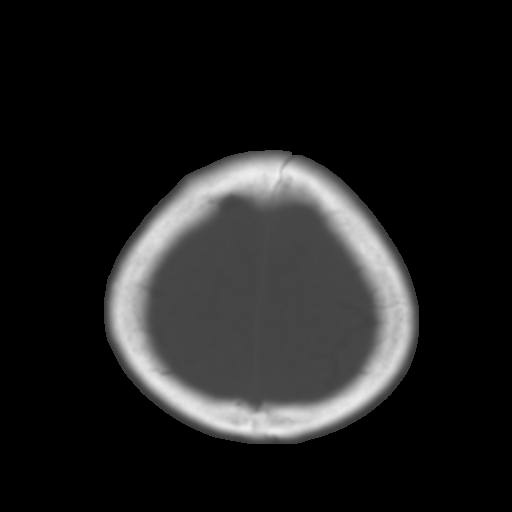
[im 28/30  brain]
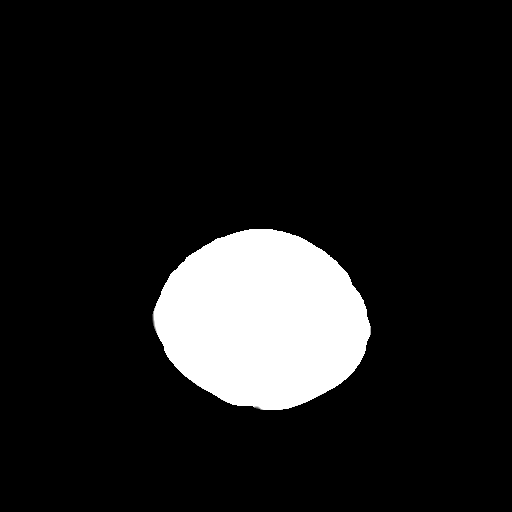

[Series 4: coronal soft tissue · coronal · 0.33mm/px · 3 of 62 slices shown]
[im 21/62  brain]
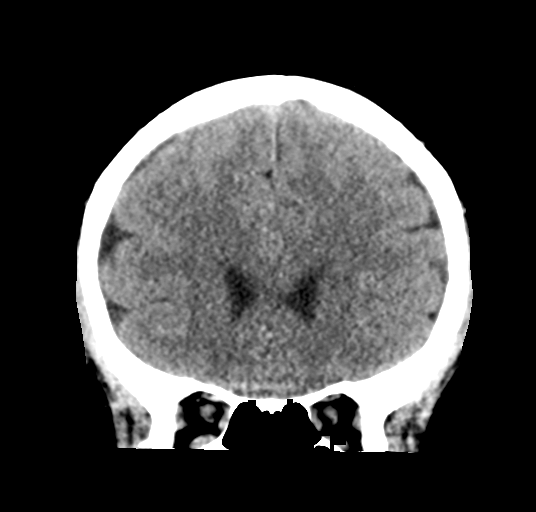
[im 28/62  brain]
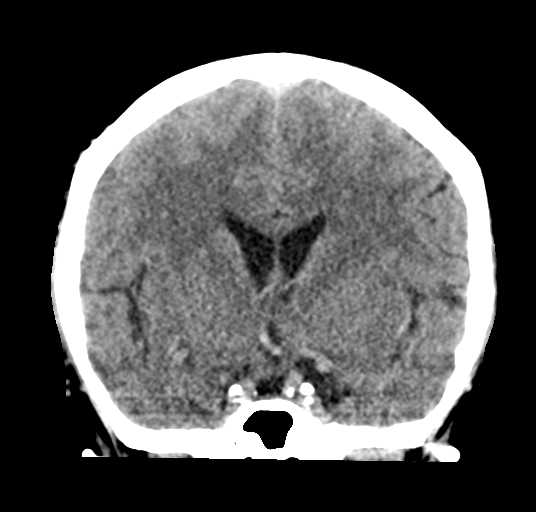
[im 34/62  brain]
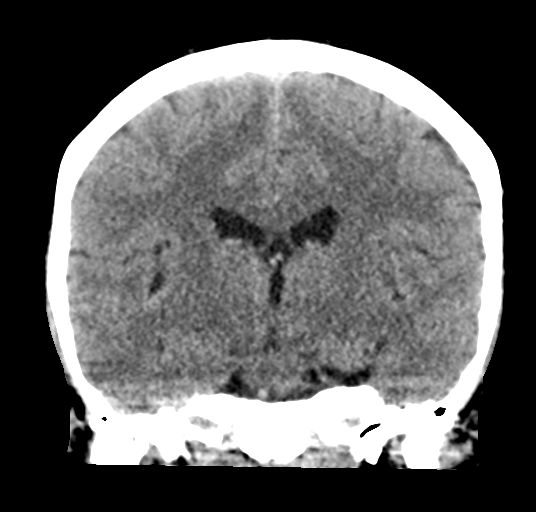

[Series 5: sagittal soft tissue · sagittal · 0.33mm/px · 3 of 58 slices shown]
[im 20/58  brain]
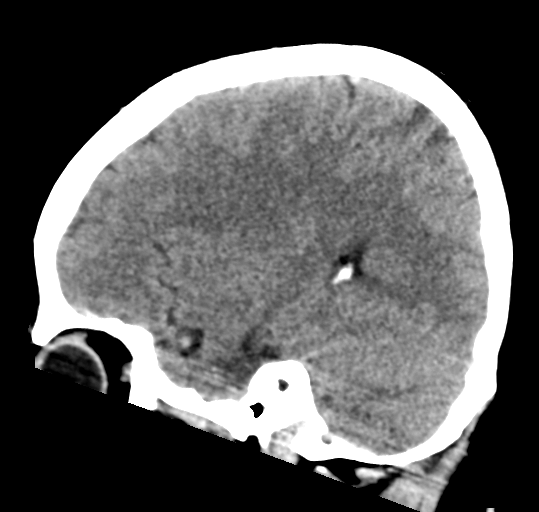
[im 29/58  brain]
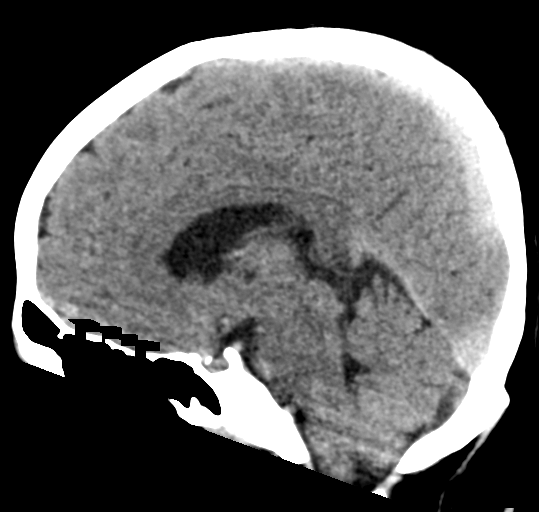
[im 39/58  brain]
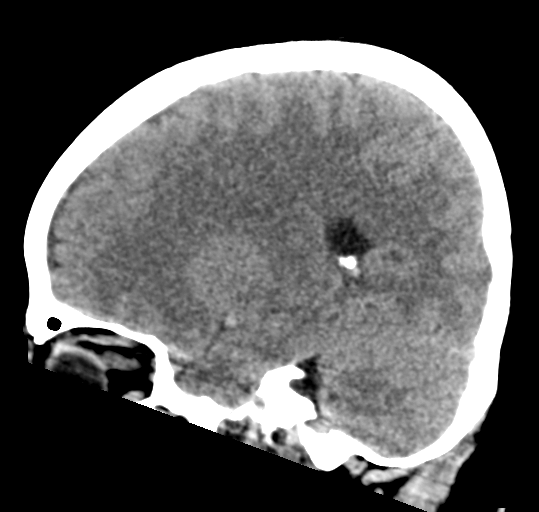

[16 of 47 positions shown; findings below may reference images not displayed]

FINDINGS: Brain: No evidence of acute infarction, hemorrhage, hydrocephalus,
extra-axial collection or mass lesion/mass effect.

Vascular: No hyperdense vessel or unexpected calcification.

Skull: Normal. Negative for fracture or focal lesion.

Sinuses/Orbits: Normal

Other: None
IMPRESSION: Normal exam.

## 2022-06-26 ENCOUNTER — Encounter: Payer: Self-pay | Admitting: Emergency Medicine

## 2022-06-26 ENCOUNTER — Ambulatory Visit
Admission: EM | Admit: 2022-06-26 | Discharge: 2022-06-26 | Disposition: A | Discharge status: Home / Self Care | Payer: Medicaid Other

## 2022-06-26 DIAGNOSIS — L03011 Cellulitis of right finger: Secondary | ICD-10-CM | POA: Diagnosis not present

## 2022-06-26 MED ORDER — CEPHALEXIN 500 MG PO CAPS: 500.0000 mg | ORAL_CAPSULE | Freq: Four times a day (QID) | ORAL | 0 refills | Status: AC

## 2022-06-26 MED ORDER — DOXYCYCLINE HYCLATE 100 MG PO CAPS: 100.0000 mg | ORAL_CAPSULE | Freq: Two times a day (BID) | ORAL | 0 refills | Status: AC

## 2022-06-26 NOTE — ED Triage Notes (Signed)
Pt presents with a right ring finger infection x 3 days. The area is swollen and red.

## 2022-06-26 NOTE — Discharge Instructions (Addendum)
-  You have an infection of your finger.  It came from picking your cuticles.  Try to stop picking her cuticles. - I have drained some of the pus but you still need antibiotics. - I sent 2 different antibiotics for you start right away. - You may continue with warm soaks.  You may notice some more pustular drainage.  Over the next 2 days your symptoms should significantly improve.  However if they are not improving or if they worsen need to be seen again right away.  Go to the ER for any acute worsening of symptoms or if you develop a fever.

## 2022-06-26 NOTE — ED Provider Notes (Signed)
[facility]    CSN: [id number] Arrival date & time: 06/26/22  0850      History   Chief Complaint Chief Complaint  Patient presents with   Hand Pain    HPI Tami Wallace is a 47 y.o. female presenting for pain, swelling and redness of the right fourth finger for the past 3 days.  She says she bites and picks at her cuticles.  She says she developed a area of swelling around the cuticle which started to spread to finger and got worse over the past 24 hours.  She reports trying to push and squeeze on the finger to relieve some pustular material but was unable to do so.  Denies any fever.  No problems moving the finger.  No numbness or weakness.  No history of similar problems and no known history of MRSA.  No other complaints.  HPI  History reviewed. No pertinent past medical history.  There are no problems to display for this patient.   History reviewed. No pertinent surgical history.  OB History   No obstetric history on file.      Home Medications    Prior to Admission medications   Medication Sig Start Date End Date Taking? Authorizing Provider  amLODipine (NORVASC) 10 MG tablet Take by mouth. 08/27/20  Yes [provider]  apixaban (ELIQUIS) 5 MG TABS tablet Take by mouth. 08/25/20  Yes [provider]  atorvastatin (LIPITOR) 80 MG tablet Take 1 tablet by mouth daily. 08/27/20  Yes [provider]  carvedilol (COREG) 12.5 MG tablet Take by mouth. 10/10/20  Yes [provider]  cephALEXin (KEFLEX) 500 MG capsule Take 1 capsule (500 mg total) by mouth 4 (four) times daily for 7 days. 06/26/22 07/03/22 Yes Shirlee Latch, PA-C  doxycycline (VIBRAMYCIN) 100 MG capsule Take 1 capsule (100 mg total) by mouth 2 (two) times daily for 7 days. 06/26/22 07/03/22 Yes Shirlee Latch, PA-C  losartan (COZAAR) 100 MG tablet Take 1 tablet by mouth daily. 10/10/20  Yes [provider]  lisinopril (ZESTRIL) 10 MG tablet Take 1 tablet (10  mg total) by mouth daily. 08/25/19 08/24/20  Shaune Pollack, MD  naproxen (NAPROSYN) 500 MG tablet Take 1 tablet (500 mg total) by mouth 2 (two) times daily with a meal. 04/15/19   Joni Reining, PA-C    Family History No family history on file.  Social History Social History   Tobacco Use   Smoking status: Every Day   Smokeless tobacco: Never  Vaping Use   Vaping Use: Never used  Substance Use Topics   Alcohol use: Not Currently   Drug use: Never     Allergies   Patient has no known allergies.   Review of Systems Review of Systems  Constitutional:  Negative for fatigue and fever.  Musculoskeletal:  Positive for arthralgias and joint swelling.  Skin:  Positive for color change. Negative for wound.  Neurological:  Negative for weakness and numbness.     Physical Exam Triage Vital Signs ED Triage Vitals  Enc Vitals Group     BP 06/26/22 0947 (!) 182/108     Pulse Rate 06/26/22 0947 95     Resp 06/26/22 0947 18     Temp 06/26/22 0947 98.5 F (36.9 C)     Temp Source 06/26/22 0947 Oral     SpO2 06/26/22 0947 100 %     Weight --      Height --  Head Circumference --      Peak Flow --      Pain Score 06/26/22 0945 6     Pain Loc --      Pain Edu? --      Excl. in [place]? --    No data found.  Updated Vital Signs BP (!) 182/108 (BP Location: Left Arm) Comment: Pt has not taken BP medication this morning  Pulse 95   Temp 98.5 F (36.9 C) (Oral)   Resp 18   SpO2 100%    Physical Exam Vitals and nursing note reviewed.  Constitutional:      General: She is not in acute distress.    Appearance: Normal appearance. She is not ill-appearing or toxic-appearing.  HENT:     Head: Normocephalic and atraumatic.  Eyes:     General: No scleral icterus.       Right eye: No discharge.        Left eye: No discharge.     Conjunctiva/sclera: Conjunctivae normal.  Cardiovascular:     Rate and Rhythm: Normal rate.  Pulmonary:     Effort: Pulmonary effort is normal.  No respiratory distress.  Musculoskeletal:     Cervical back: Neck supple.  Skin:    General: Skin is dry.     Comments: Right fourth finger: See image below.  There is significant swelling of the distal digit and swelling of the cuticle and finger pad.  Tenderness diffusely of the finger.  Red streaking up the finger.  Neurological:     General: No focal deficit present.     Mental Status: She is alert. Mental status is at baseline.     Motor: No weakness.     Gait: Gait normal.  Psychiatric:        Mood and Affect: Mood normal.        Behavior: Behavior normal.        Thought Content: Thought content normal.           UC Treatments / Results  Labs (all labs ordered are listed, but only abnormal results are displayed) Labs Reviewed - No data to display  EKG   Radiology No results found.  Procedures Incision and Drainage  Date/Time: 06/26/2022 10:56 AM  Performed by: Shirlee Latch, PA-C Authorized by: Shirlee Latch, PA-C   Consent:    Consent obtained:  Verbal   Consent given by:  Patient   Risks discussed:  Bleeding, incomplete drainage and pain   Alternatives discussed:  No treatment, delayed treatment and alternative treatment Universal protocol:    Patient identity confirmed:  Verbally with patient Location:    Type:  Abscess   Size:  0.5 cm   Location:  Upper extremity   Upper extremity location:  Finger   Finger location:  R ring finger Pre-procedure details:    Skin preparation:  Chlorhexidine with alcohol Sedation:    Sedation type:  None Anesthesia:    Anesthesia method:  None Procedure type:    Complexity:  Simple Procedure details:    Incision types:  Single straight   Drainage:  Purulent   Drainage amount:  Scant   Wound treatment:  Wound left open Post-procedure details:    Procedure completion:  Tolerated well, no immediate complications Comments:     Used 21G needle  (including critical care time)  Medications Ordered in  UC Medications - No data to display  Initial Impression / Assessment and Plan / UC Course  I have reviewed  the triage vital signs and the nursing notes.  Pertinent labs & imaging results that were available during my care of the patient were reviewed by me and considered in my medical decision making (see chart for details).   47 year old female presents for paronychia of the right fourth finger and cellulitis of finger x 3 days.  Images included in chart of patient's finger.  Patient has obvious paronychia and spread of cellulitis.  Concern for possible felon as well.  Used 21-gauge needle to drain pustular material from the cuticle.  She tolerated this well.  Cleaned with alcohol and covered with a Band-Aid.  Will treat patient at this time with Keflex and doxycycline to cover infection.  Tylenol or Motrin for pain relief.  Also advised warm soaks.  Advised very close monitoring and return or go to ER for acute worsening of symptoms or if symptoms or not improving in the next 24 to 48 hours.   Final Clinical Impressions(s) / UC Diagnoses   Final diagnoses:  Cellulitis of right ring finger     Discharge Instructions      -You have an infection of your finger.  It came from picking your cuticles.  Try to stop picking her cuticles. - I have drained some of the pus but you still need antibiotics. - I sent 2 different antibiotics for you start right away. - You may continue with warm soaks.  You may notice some more pustular drainage.  Over the next 2 days your symptoms should significantly improve.  However if they are not improving or if they worsen need to be seen again right away.  Go to the ER for any acute worsening of symptoms or if you develop a fever.     ED Prescriptions     Medication Sig Dispense Auth. Provider   cephALEXin (KEFLEX) 500 MG capsule Take 1 capsule (500 mg total) by mouth 4 (four) times daily for 7 days. 28 capsule Eusebio Friendly B, PA-C   doxycycline  (VIBRAMYCIN) 100 MG capsule Take 1 capsule (100 mg total) by mouth 2 (two) times daily for 7 days. 14 capsule Shirlee Latch, PA-C      PDMP not reviewed this encounter.   Shirlee Latch, PA-C 06/26/22 1059

## 2022-06-29 DIAGNOSIS — E8881 Metabolic syndrome: Secondary | ICD-10-CM | POA: Insufficient documentation

## 2022-06-29 DIAGNOSIS — Z8679 Personal history of other diseases of the circulatory system: Secondary | ICD-10-CM | POA: Insufficient documentation

## 2022-06-30 ENCOUNTER — Ambulatory Visit: Payer: Medicaid Other | Admitting: Physician Assistant

## 2022-06-30 ENCOUNTER — Other Ambulatory Visit: Payer: Self-pay

## 2023-02-26 ENCOUNTER — Ambulatory Visit: Payer: Self-pay
